# Patient Record
Sex: Female | Born: 1980 | Race: White | Hispanic: No | Marital: Married
Health system: Southern US, Community
[De-identification: ages and names within clinical notes are randomized; demographics above are authoritative.]

---

## 2004-07-08 ENCOUNTER — Emergency Department: Payer: Self-pay | Admitting: Emergency Medicine

## 2004-07-17 ENCOUNTER — Emergency Department: Payer: Self-pay | Admitting: Emergency Medicine

## 2004-07-21 ENCOUNTER — Inpatient Hospital Stay (HOSPITAL_COMMUNITY): Admission: AD | Admit: 2004-07-21 | Discharge: 2004-07-21 | Payer: Self-pay | Admitting: Obstetrics and Gynecology

## 2004-07-27 ENCOUNTER — Encounter (INDEPENDENT_AMBULATORY_CARE_PROVIDER_SITE_OTHER): Payer: Self-pay | Admitting: *Deleted

## 2004-07-27 ENCOUNTER — Ambulatory Visit (HOSPITAL_COMMUNITY): Admission: AD | Admit: 2004-07-27 | Discharge: 2004-07-27 | Payer: Self-pay | Admitting: Obstetrics and Gynecology

## 2004-07-30 ENCOUNTER — Encounter (INDEPENDENT_AMBULATORY_CARE_PROVIDER_SITE_OTHER): Payer: Self-pay | Admitting: *Deleted

## 2004-07-30 ENCOUNTER — Inpatient Hospital Stay (HOSPITAL_COMMUNITY): Admission: AD | Admit: 2004-07-30 | Discharge: 2004-07-31 | Payer: Self-pay | Admitting: Gynecology

## 2005-07-12 IMAGING — US US OB COMP LESS 14 WK
1 series · 18 of 28 positions shown · non-contrast
Comparison: none

CLINICAL DATA: 10 week 3 day gestational age by LMP.  Rt sided pelvic pain.

[Series 1: us ob comp<14 wk · 18 of 37 slices shown]
[im 1/37]
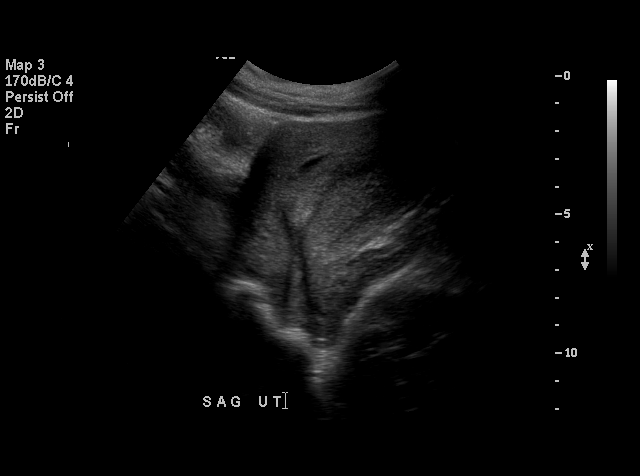
[im 3/37]
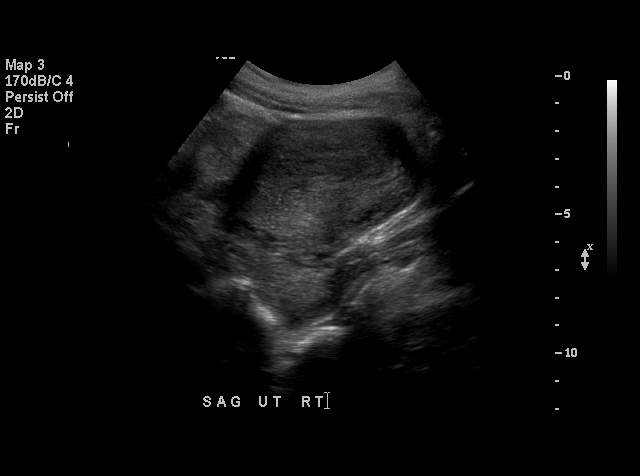
[im 5/37]
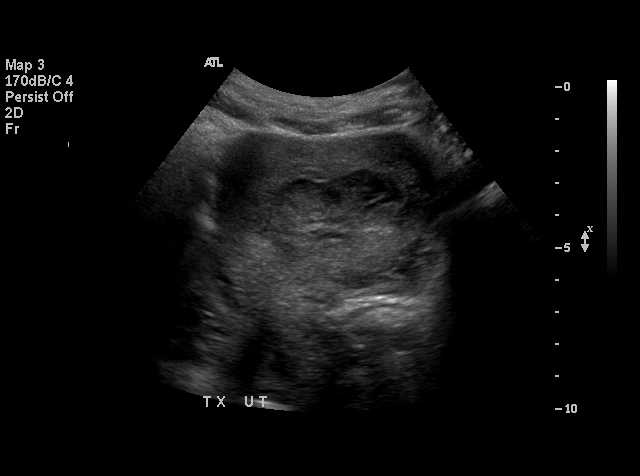
[im 7/37]
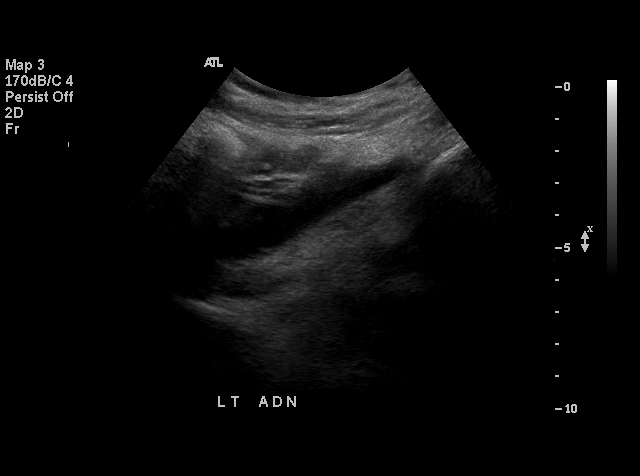
[im 10/37]
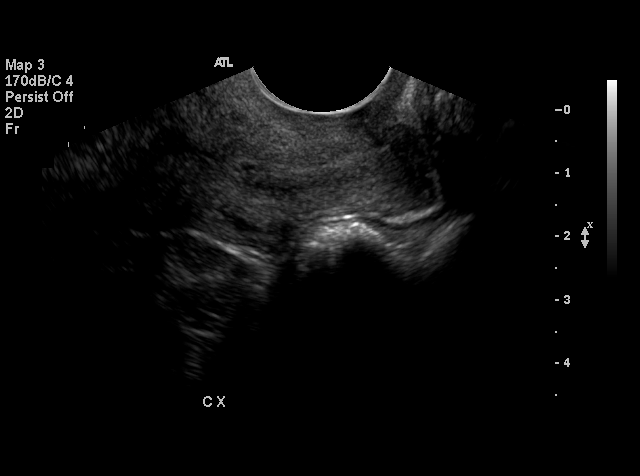
[im 11/37]
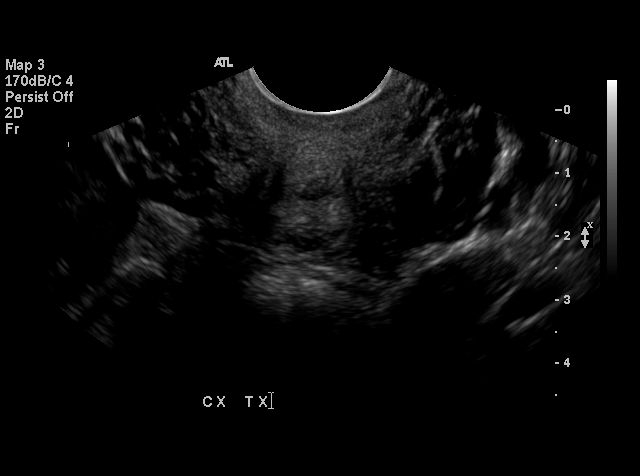
[im 14/37]
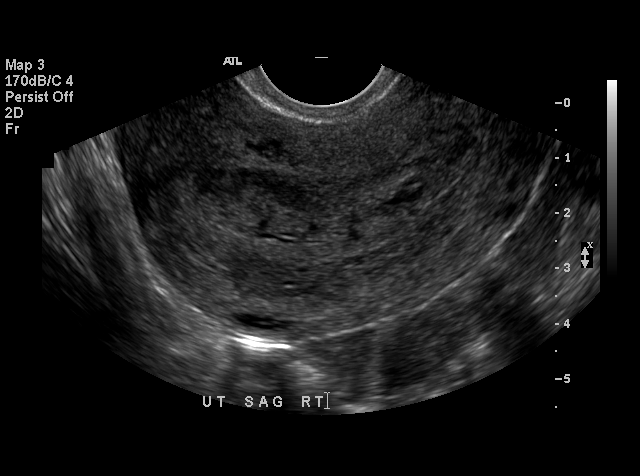
[im 15/37]
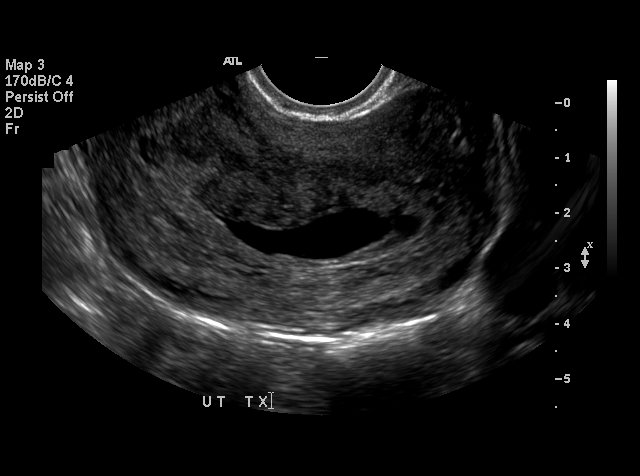
[im 18/37]
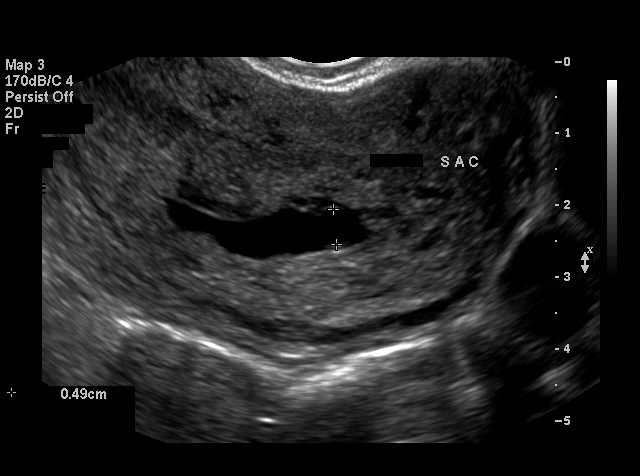
[im 19/37]
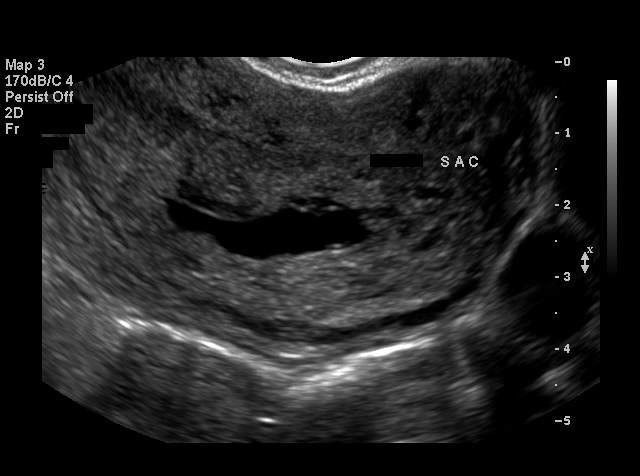
[im 22/37]
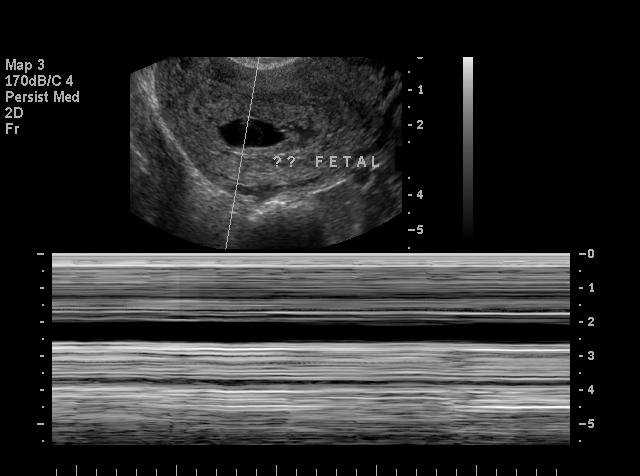
[im 23/37]
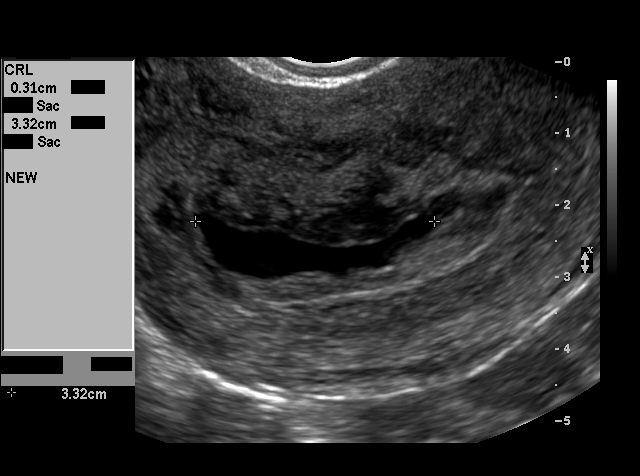
[im 26/37]
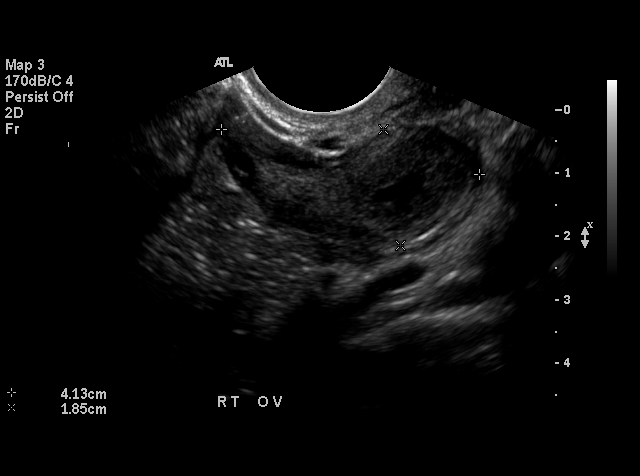
[im 29/37]
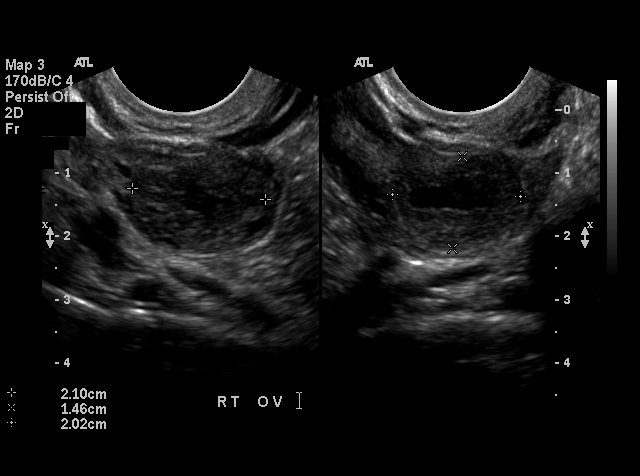
[im 30/37]
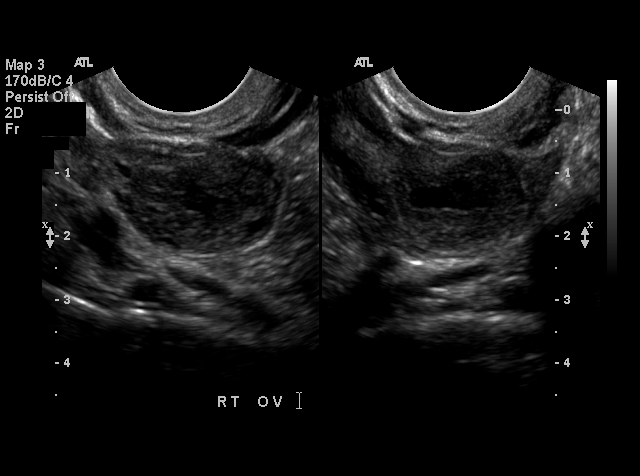
[im 33/37]
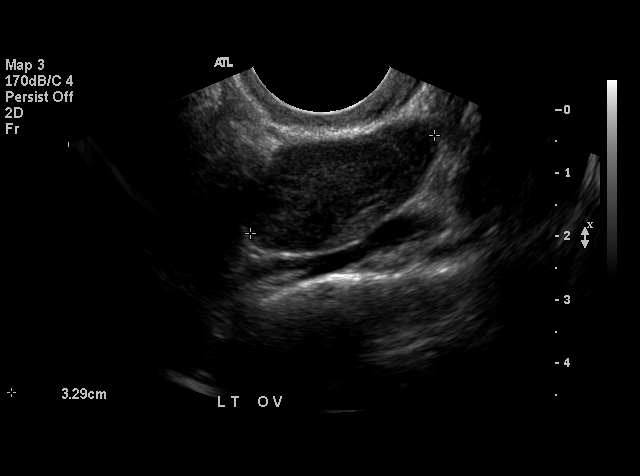
[im 34/37]
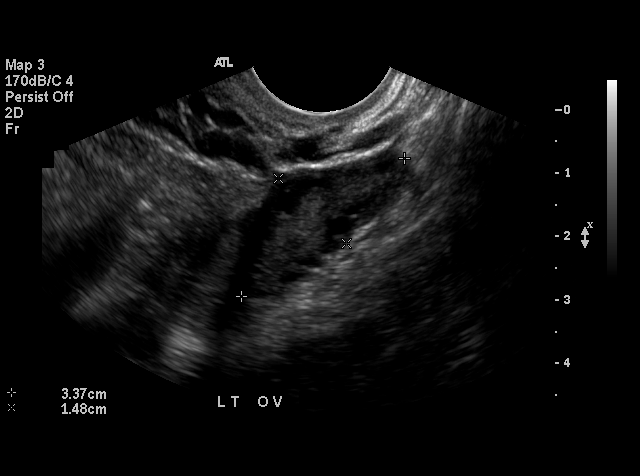
[im 37/37]
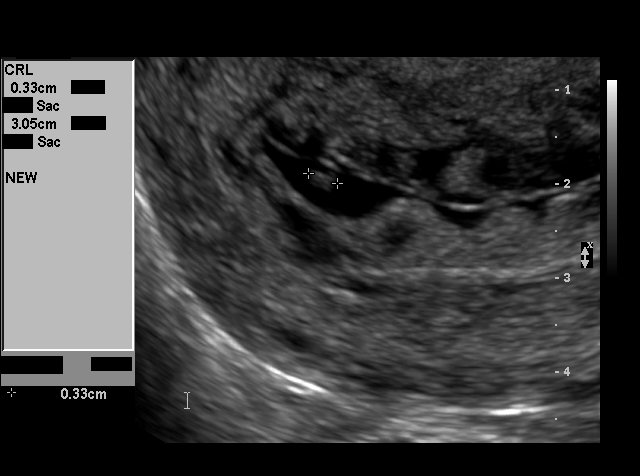

[18 of 28 positions shown; findings below may reference images not displayed]

OBSTETRICAL ULTRASOUND WITH TRANSVAGINAL:

A single intrauterine gestational sac is seen which has a very irregular shape.  The mean sac
diameter measures 3.1 cm, corresponding with a gestational age of 8 weeks 3 days.  A yolk sac is
seen as well as an embryo with crown rump length of 3 mm, corresponding with a gestational age of 6
weeks 0 days.  No embryonic cardiac activity is visualized, although this may not be seen until
crown rump length reaches 5 mm.  Small subchorionic hemorrhage is noted.  

A corpus luteum cyst is noted in the right ovary measuring approximately 2.0 cm.  The left ovary is
normal in appearance.  There is no evidence of adnexal masses or free fluid.
IMPRESSION: 1.  Single intrauterine gestation, with estimated gestational age of 6 weeks 0 days by crown rump
length.  This is approximately 4 weeks behind LMP and the gestational sac has a very irregular
shape, which is a poor prognostic sign.  Follow-up ultrasound is recommended in approximately 4-5
days to assess pregnancy progression.

2.  Small subchorionic hemorrhage noted.  

3.  Small right ovarian corpus luteum.  No other adnexal mass or free fluid.

## 2005-07-22 IMAGING — US US OB TRANSVAGINAL
1 series · 18 of 28 positions shown · non-contrast
Comparison: none

CLINICAL DATA: D&C with persistent bleeding.
 ULTRASOUND OF THE PELVIS, COMPLETE:
 This study is compared with the previous study from 07/27/04.  
 The endometrium is thickened at 27.5 mm.  There is a probable persistent gestational sac in the lower uterine segment with what appears to be some amnion.  There is also what appears to be hematoma in the cervix.  No free fluid is seen.  The ovaries are normal in appearance.

[Series 1: us pelvis complete · 18 of 49 slices shown]
[im 1/49]
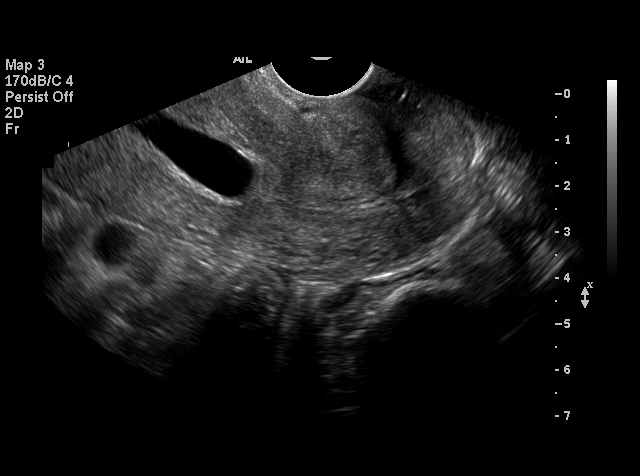
[im 4/49]
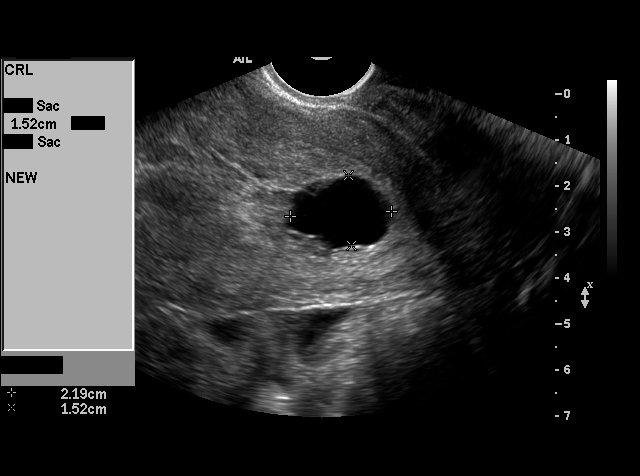
[im 6/49]
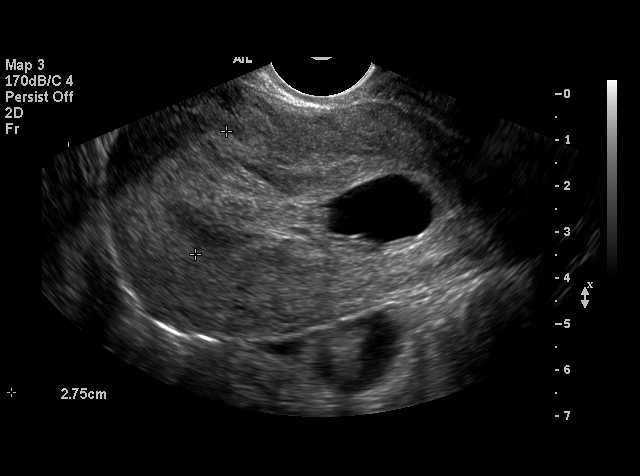
[im 9/49]
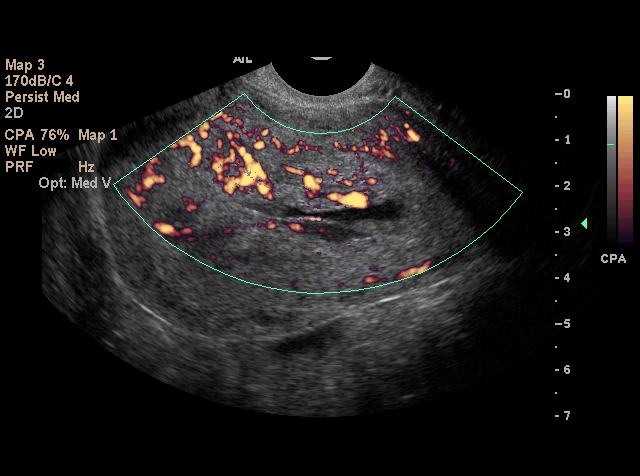
[im 13/49]
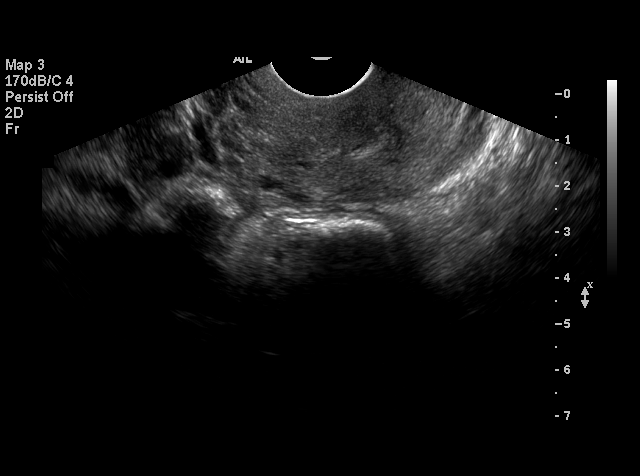
[im 15/49]
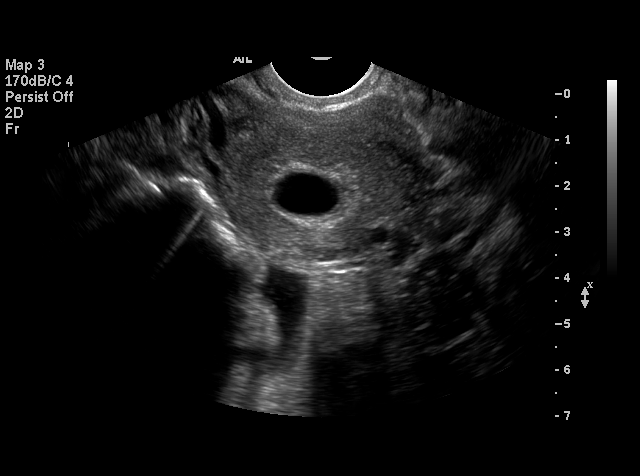
[im 18/49]
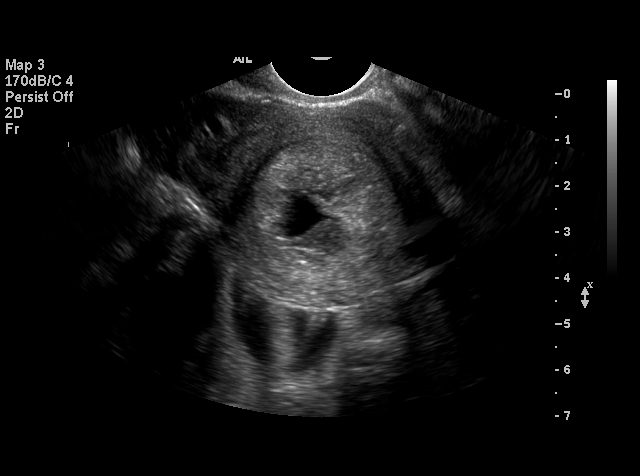
[im 20/49]
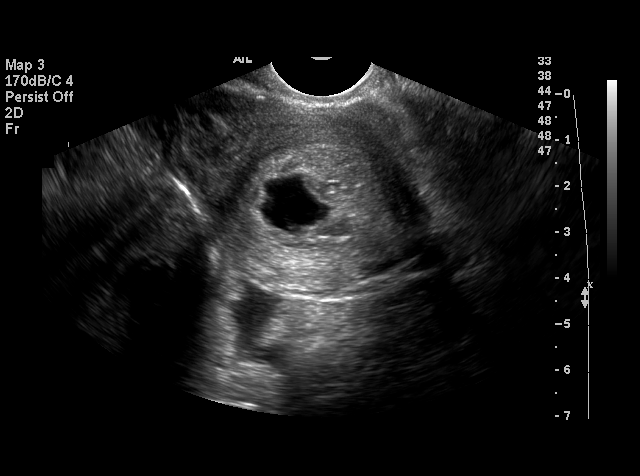
[im 24/49]
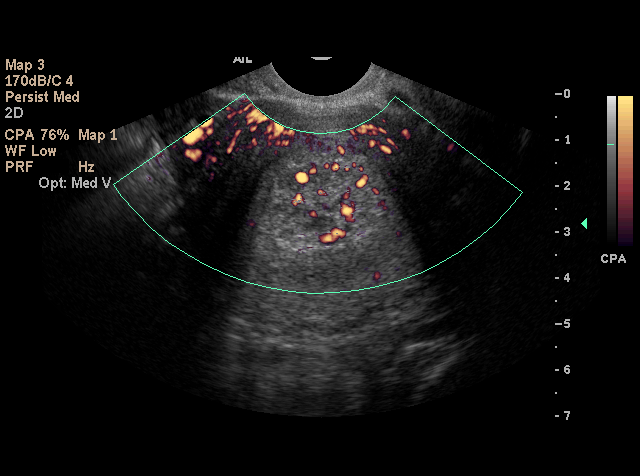
[im 25/49]
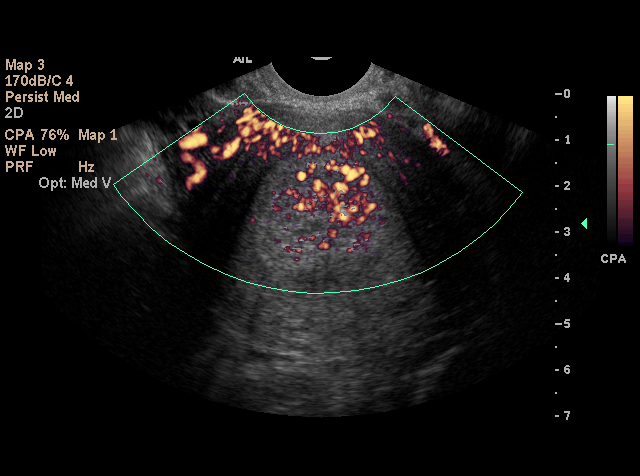
[im 29/49]
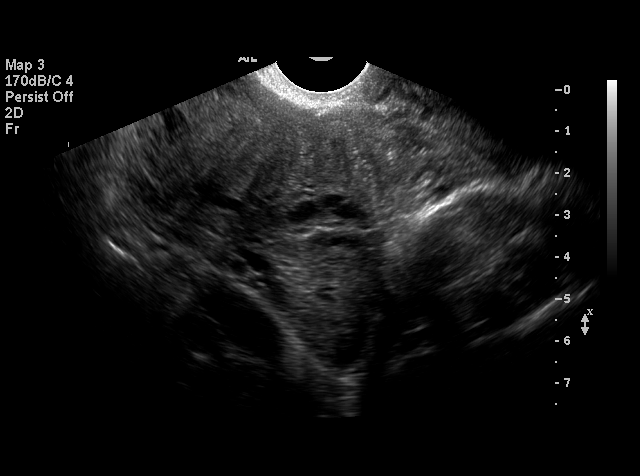
[im 31/49]
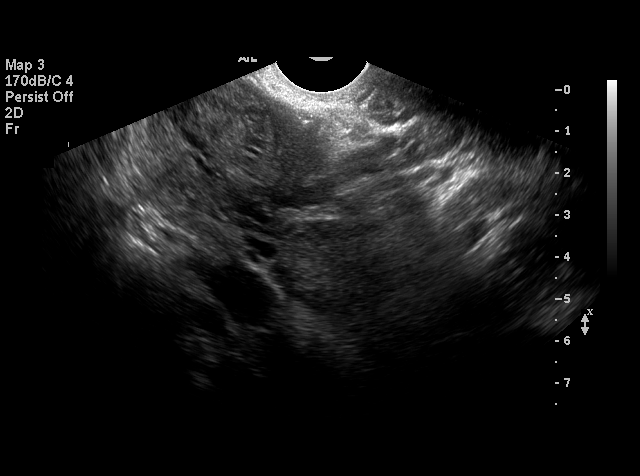
[im 34/49]
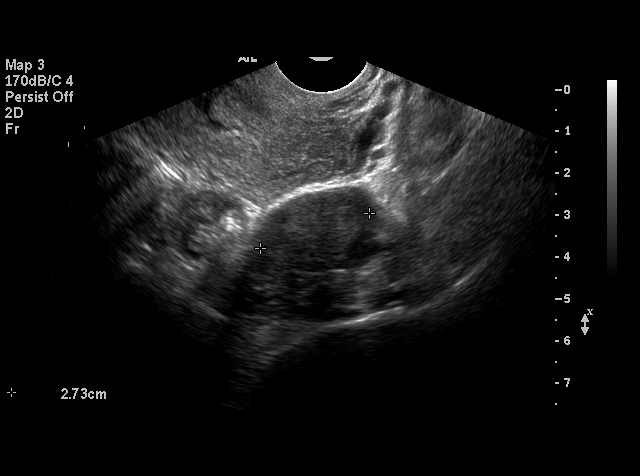
[im 38/49]
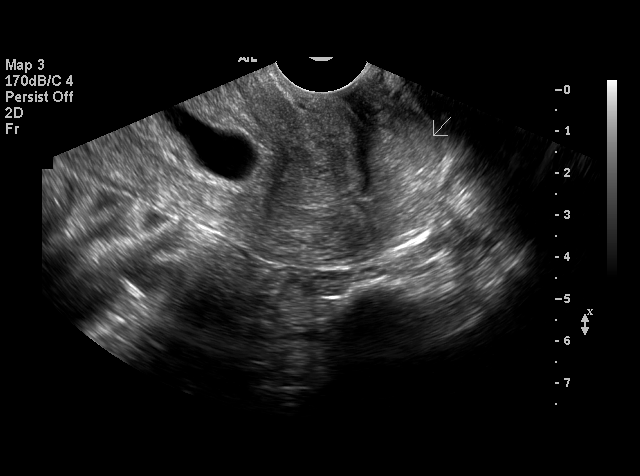
[im 40/49]
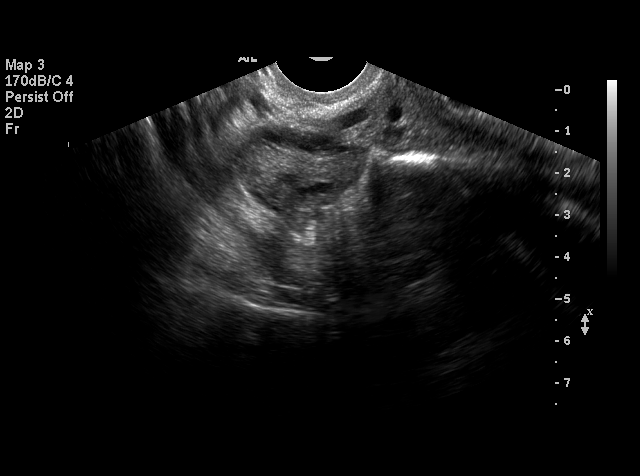
[im 43/49]
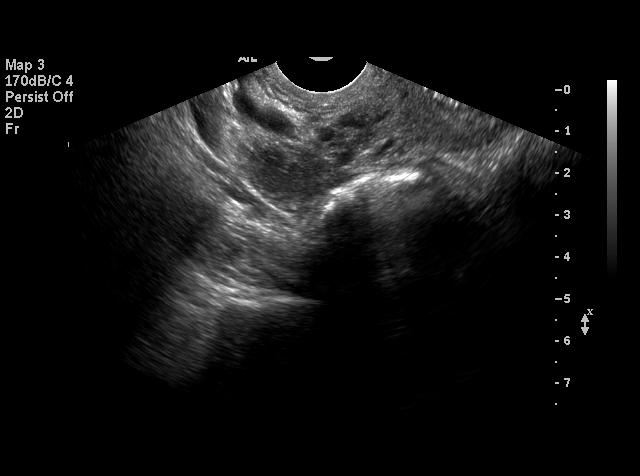
[im 45/49]
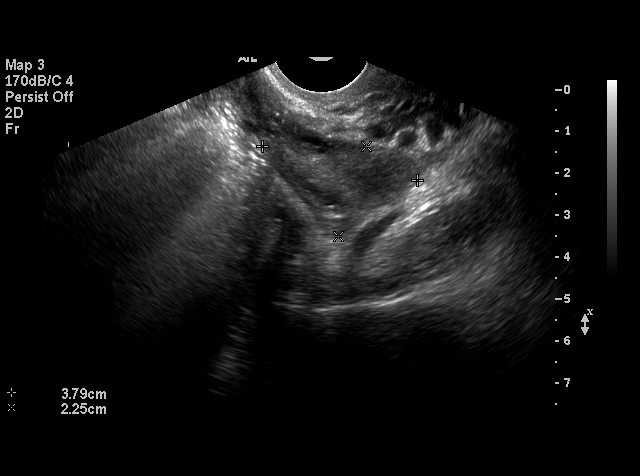
[im 49/49]
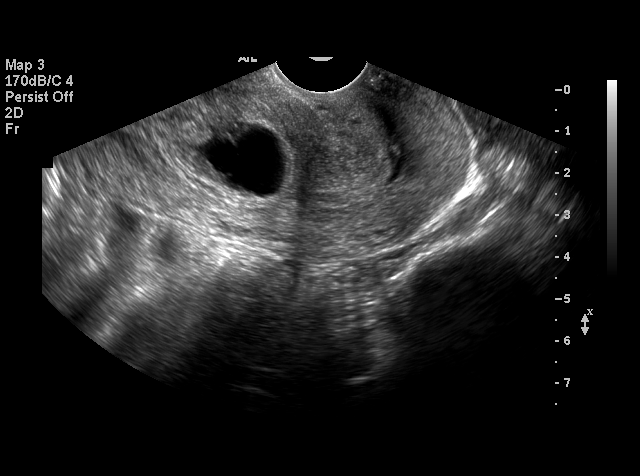

[18 of 28 positions shown; findings below may reference images not displayed]

IMPRESSION: 1.  Probable retained products with retained gestational sac in the lower uterine segment and endometrial thickening.  There also appears to be some hematoma in and around the cervix.  
 2.  Normal ovaries.

## 2005-07-22 IMAGING — US US OB COMP LESS 14 WK
1 series · 14 of 28 positions shown · non-contrast
Comparison: none

CLINICAL DATA: Incomplete abortion.  Failed IUP.  Continued vaginal bleeding.  Evaluate for retained products. 
OBSTETRICAL ULTRASOUND:
Compared with previous study earlier today, an abnormally shaped intrauterine gestational sac is again seen in the lower uterine segment which is not significantly changed.  No other uterine abnormalities are seen.  No adnexal abnormalities or free fluid are identified.

[Series 1: us ob comp less 14 wk · 0.29mm/px · 14 of 29 slices shown]
[im 2/29]
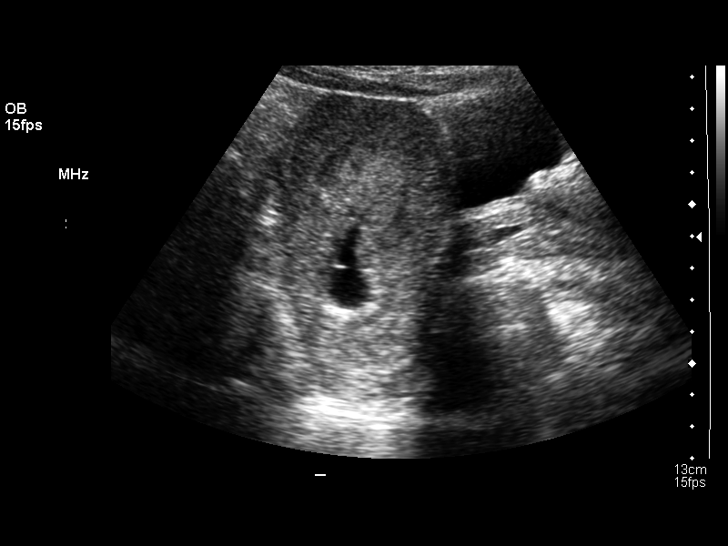
[im 4/29]
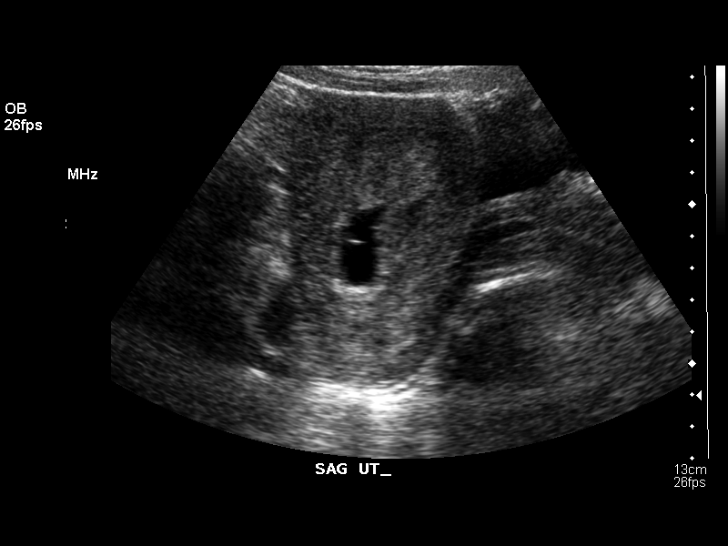
[im 6/29]
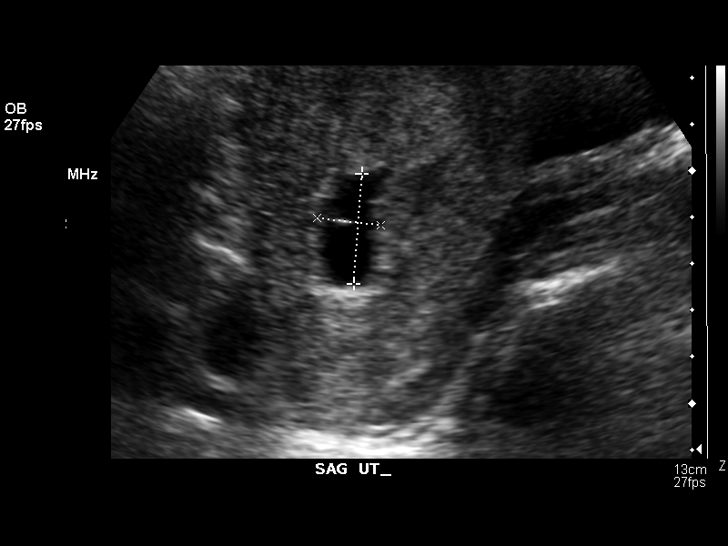
[im 8/29]
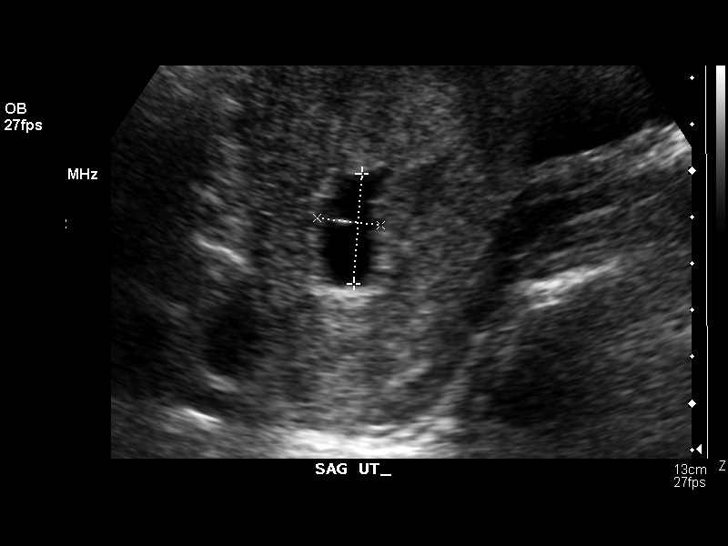
[im 10/29]
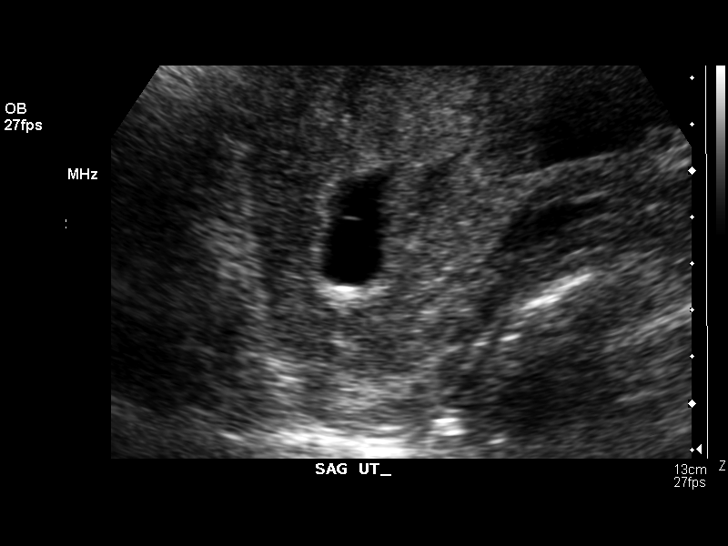
[im 12/29]
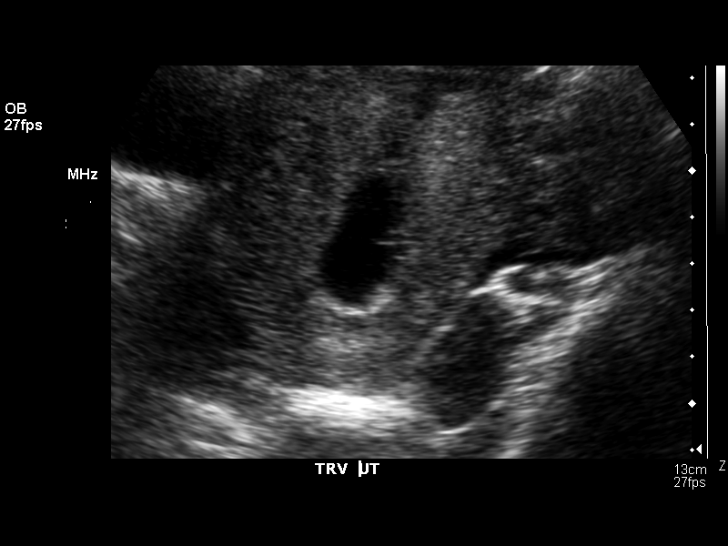
[im 14/29]
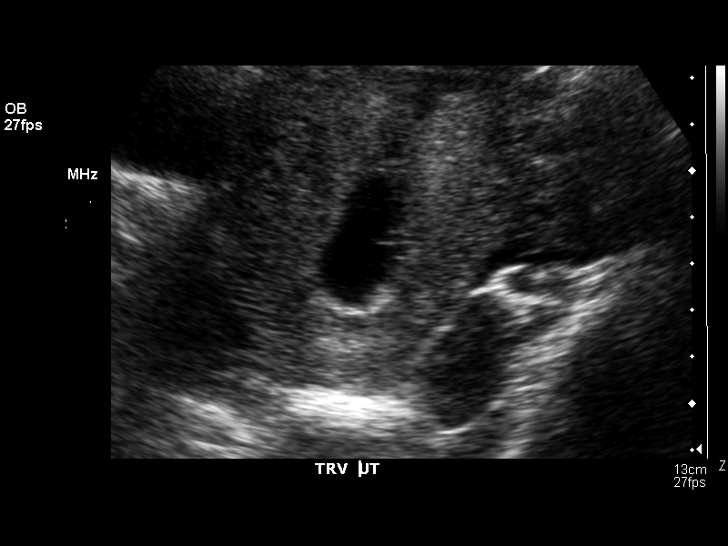
[im 16/29]
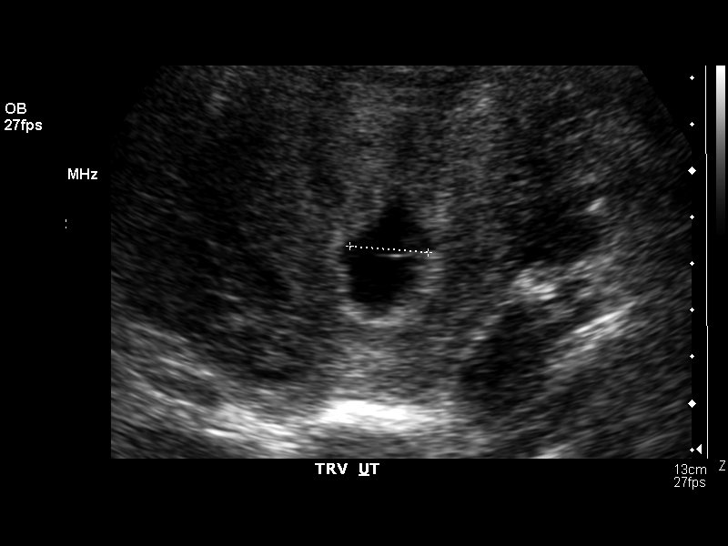
[im 18/29]
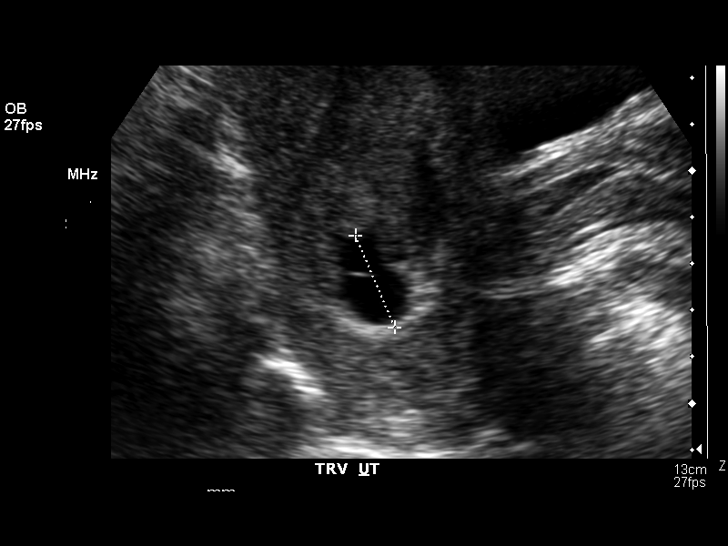
[im 20/29]
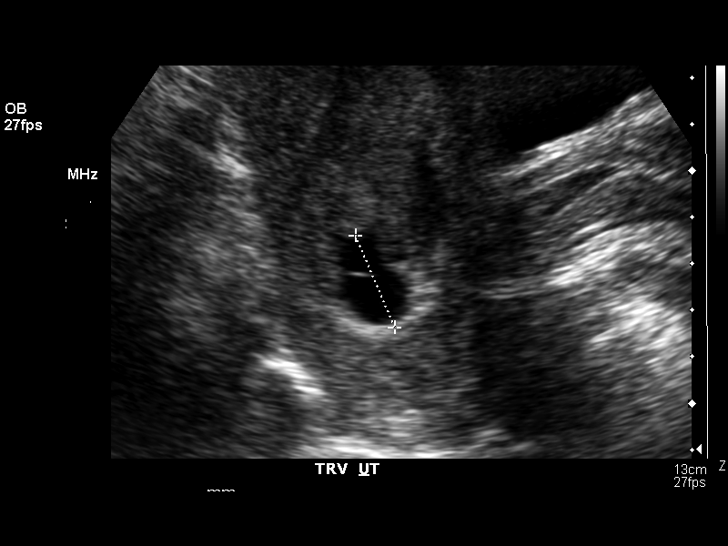
[im 22/29]
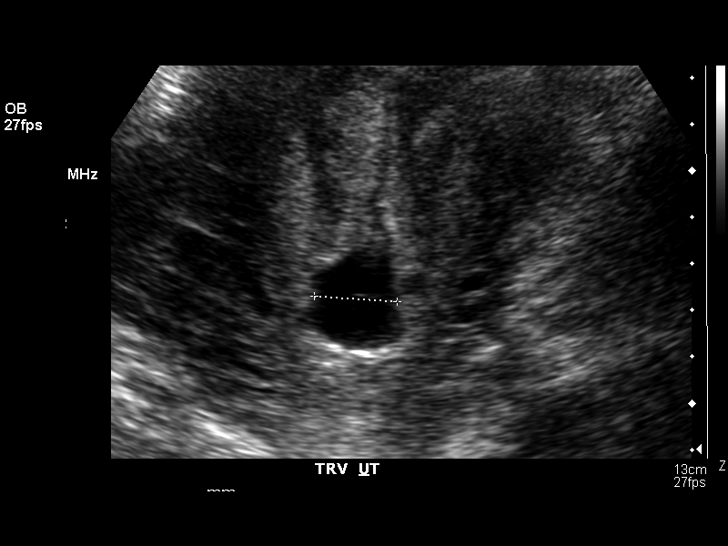
[im 24/29]
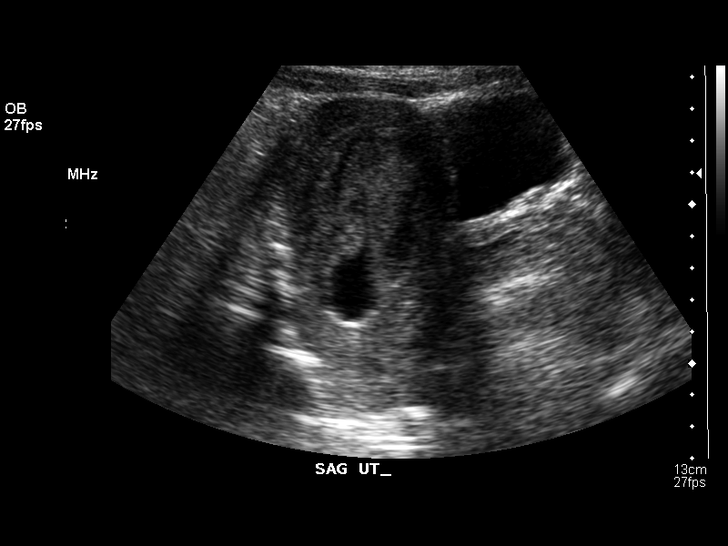
[im 26/29]
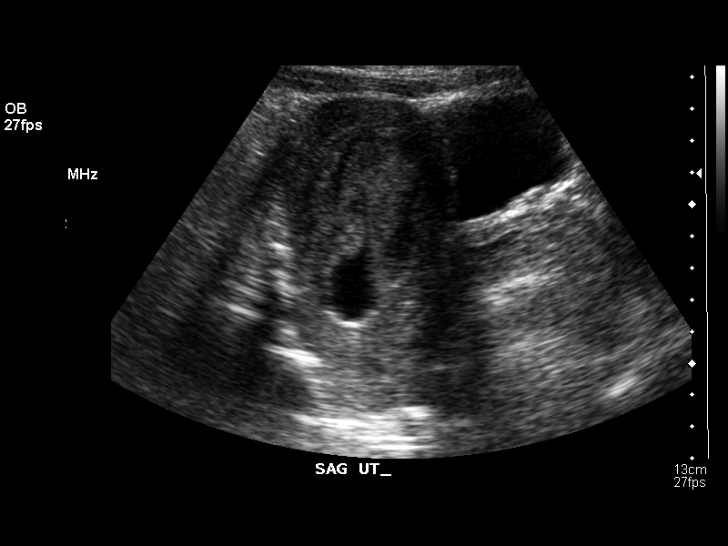
[im 29/29]
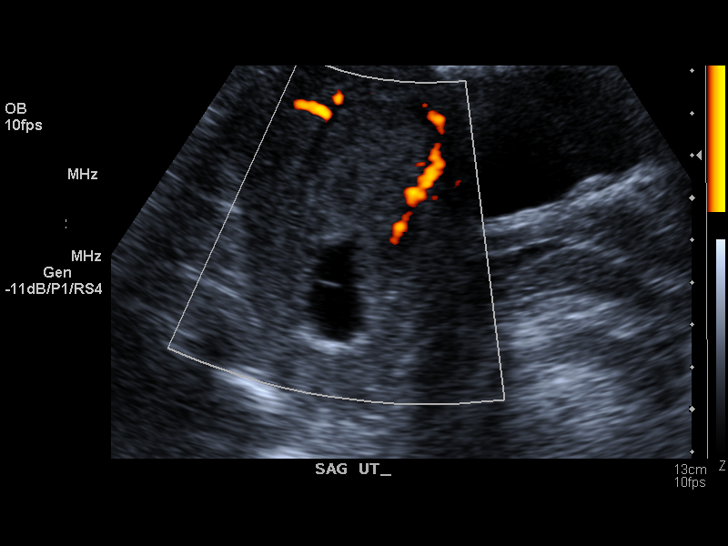

[14 of 28 positions shown; findings below may reference images not displayed]

IMPRESSION: Persistent abnormal intrauterine gestational sac within the lower uterine segment, and without significant change since prior study.

## 2007-03-06 ENCOUNTER — Inpatient Hospital Stay: Payer: Self-pay

## 2008-01-16 ENCOUNTER — Emergency Department (HOSPITAL_COMMUNITY): Admission: EM | Admit: 2008-01-16 | Discharge: 2008-01-16 | Payer: Self-pay | Admitting: Emergency Medicine

## 2008-10-18 ENCOUNTER — Emergency Department: Payer: Self-pay | Admitting: Internal Medicine

## 2008-12-20 ENCOUNTER — Emergency Department: Payer: Self-pay | Admitting: Emergency Medicine

## 2009-01-24 ENCOUNTER — Emergency Department: Payer: Self-pay | Admitting: Emergency Medicine

## 2009-04-12 ENCOUNTER — Emergency Department: Payer: Self-pay | Admitting: Emergency Medicine

## 2009-05-02 ENCOUNTER — Emergency Department: Payer: Self-pay | Admitting: Unknown Physician Specialty

## 2009-08-02 ENCOUNTER — Emergency Department: Payer: Self-pay | Admitting: Emergency Medicine

## 2009-10-19 ENCOUNTER — Ambulatory Visit: Payer: Self-pay | Admitting: Unknown Physician Specialty

## 2009-12-28 ENCOUNTER — Emergency Department: Payer: Self-pay | Admitting: Emergency Medicine

## 2010-01-09 ENCOUNTER — Emergency Department: Payer: Self-pay | Admitting: Emergency Medicine

## 2010-02-27 ENCOUNTER — Ambulatory Visit: Payer: Self-pay

## 2010-08-07 ENCOUNTER — Observation Stay: Payer: Self-pay | Admitting: Obstetrics and Gynecology

## 2010-09-04 ENCOUNTER — Inpatient Hospital Stay: Payer: Self-pay

## 2010-10-05 ENCOUNTER — Encounter: Payer: Self-pay | Admitting: Obstetrics and Gynecology

## 2010-12-10 ENCOUNTER — Ambulatory Visit: Payer: Self-pay | Admitting: Internal Medicine

## 2011-01-08 ENCOUNTER — Emergency Department: Payer: Self-pay | Admitting: Emergency Medicine

## 2011-01-31 NOTE — Op Note (Signed)
[name], Weeks           ACCOUNT NO.:  [id number]   MEDICAL RECORD NO.:  [id number]          PATIENT TYPE:  INP   LOCATION:  9374                          FACILITY:  [facility]   PHYSICIAN:  Ivor Costa. Farrel Gobble, M.D. DATE OF BIRTH:  January 08, 1981   DATE OF PROCEDURE:  07/31/2004  DATE OF DISCHARGE:  07/31/2004                                 OPERATIVE REPORT   PREOPERATIVE DIAGNOSES:  Retained products of conception after first  trimester dilation and curettage.   POSTOPERATIVE DIAGNOSES:  Retained products of conception after first  trimester dilation and curettage.   PROCEDURE:  Suction D&C.   SURGEON:  Ivor Costa. Farrel Gobble, M.D.   ANESTHESIA:  MAC and paracervical block.   ESTIMATED BLOOD LOSS:  Minimal.   FINDINGS:  Uterus was anteverted, slightly enlarged, there was copious  fibrotic tissue with smooth contours at the end. Pathology was uterine  contents.   DESCRIPTION OF PROCEDURE:  The patient was taken to the operating room, MAC  anesthesia was induced, placed in the dorsal lithotomy position, prepped and  draped in the usual sterile fashion. A bimanual exam was performed.  The  orientation of the uterus was confirmed. A sterile weighted speculum was  then placed in the vagina as was the Simms. The cervix was visualized,  stabilized with a single tooth tenaculum and a paracervical block with equal  parts of 2% lidocaine and 0.25% Marcaine was placed circumferentially around  the cervix. The cervix was then gently dilated up to 25 [place] after which a  #7 suction curette was advanced through the cervix towards the fundus.  Copious amounts of fibrotic appearing tissue was obtained.  After no more  tissue was evident, a gentle curettage was performed. A slight amount of  tissue again was obtained, replacement of the suction curette came back  again negative and recuretting confirms no tissue and good cry on the  anterior wall as well as the remainder of the cavity.  The cavity  contours  were smooth. The patient tolerated the procedure well. Sponge, lap and  needle counts correct x2. She received Methergine intraoperatively and there  was minimal cervical bleeding at the end of the procedure.     Trac   THL/MEDQ  D:  07/31/2004  T:  08/01/2004  Job:  [id number]

## 2011-01-31 NOTE — Op Note (Signed)
[name], Weeks           ACCOUNT NO.:  [id number]   MEDICAL RECORD NO.:  [id number]          PATIENT TYPE:  AMB   LOCATION:  [facility]C                           FACILITY:  [facility]   PHYSICIAN:  Juan H. Lily Peer, M.D.DATE OF BIRTH:  03-16-1981   DATE OF PROCEDURE:  07/27/2004  DATE OF DISCHARGE:                                 OPERATIVE REPORT   INDICATION FOR OPERATION:  A 30 year old gravida 2, para 1, approximately 35-  week gestation by last menstrual period, but a recent ultrasound  demonstrated a 6-week irregular gestational sac, with decreasing  quantitative beta HCGs.  The patient Rh negative.  She is A-negative and  received 300 mcg of RhoGAM prior to her D and E.   PREOPERATIVE DIAGNOSES:  First trimester missed abortion.   POSTOPERATIVE DIAGNOSES:  First trimester missed abortion.   SURGEON:  Juan H. Lily Peer, M.D.   ANESTHESIA:  1.  MAC.  2.  Paracervical block with 2% Xylocaine, with 1:100,000 epinephrine for 10      cc.   PROCEDURE PERFORMED:  First trimester D and E.   DESCRIPTION OF PROCEDURE:  After the patient was adequately counseled, she  was taken to the operating room, where she underwent a successful MAC  anesthesia.  She was placed in the low lithotomy position.  Examination  under anesthesia, had demonstrated slightly anteverted, 8 to 10-week size  uterus with no palpable adnexal masses.  The cervix was slightly dilated.  The vagina and cervix were cleansed with Betadine solution.  A red rubber  Roxan Hockey was inserted, in an effort to evacuate the bladder of its contents,  for approximately 50 cc.  A single tooth tenaculum was placed on the  anterior cervical lip, after 2% Xylocaine with 1:100,000 epinephrine had  been infiltrated into the cervical stroma, at the 2, 4, 8 and 10 o'clock  positions.  The cervix was dilated to allow a 7 mm suction curette to be  introduced into the intrauterine cavity, to remove remaining products of  conception.  This  was interchanged with a Hunter's curette, to completely  evacuate the cavity.  The single tooth tenaculum was removed.  The patient  tolerated the procedure well.  She received a gram of Ancef for prophylaxis  and was transferred to recovery room with stable vital signs.  Blood loss  was less than 100 cc.  IV fluids 500 cc.     Juan   JHF/MEDQ  D:  07/27/2004  T:  07/28/2004  Job:  [id number]

## 2011-01-31 NOTE — H&P (Signed)
Kari Weeks, Kari Weeks           ACCOUNT NO.:  [id number]   MEDICAL RECORD NO.:  [id number]          PATIENT TYPE:  INP   LOCATION:  9374                          FACILITY:  [facility]   PHYSICIAN:  Juan H. Lily Peer, M.D.DATE OF BIRTH:  [birth year]   DATE OF ADMISSION:  07/30/2004  DATE OF DISCHARGE:                                HISTORY & PHYSICAL   CHIEF COMPLAINT:  Cramping and vaginal bleeding.   HISTORY:  The patient is a 30 year old, gravida 2, para 1, AB 1 (12 weeks by  last menstrual period), who on Saturday, July 27, 2004, underwent a D&E  for a missed AB.  She had received RhoGAM 300 mcg prior to her D&E in the  operating room.  The presented to the office today complaining of some  abdominal cramping and vaginal bleeding.  Her CBC had demonstrated a  hemoglobin and hematocrit of 12.4 and 37.3 respectively, with a platelet  count of 168,000.  Her pathology report from July 27, 2004 demonstrated  products of conception, and she underwent an ultrasound today which  demonstrated retained products of conception in the lower uterine segment,  measuring 2.2 x 1.6 cm.  Gestational sac with a yolk sac was still seen.  The patient is in the process of aborting.  The patient will be admitted to  [facility], placed on IV antibiotic, and see if over the course of the  next 12-14 hours she will spontaneously pass the remaining products of  conception, or, if not, she will be taken to the operating room for a repeat  and complete evacuation of the uterine cavity.   PAST MEDICAL HISTORY:  No major medical problems reported in the past.   ALLERGIES:  The patient denies any allergies.   PHYSICAL EXAMINATION:  VITAL SIGNS:  She is 127 pounds, height 5 feet, 4  inches tall.  HEENT:  Unremarkable.  NECK:  Supple.  Trachea midline.  No carotid bruits.  No thyromegaly.  LUNGS:  Clear to auscultation without rhonchi or wheezes.  HEART:  Regular rate and rhythm.  No murmurs or  gallops.  BREAST EXAM:  Not done.  ABDOMEN:  Soft, nontender, without rebound or guarding.  PELVIC:  Bartholin's, urethra, Skene's glands within normal limits.  Vagina  and cervix with blood present in the vaginal vault.  Uterus slightly tender.  Adnexa difficult to evaluate, due to some tenderness the patient had  experienced on examination.  RECTAL EXAM:  Not done.   ASSESSMENT:  A 30 year old, gravida 2, para 1, AB1, status post D&E on  July 27, 2004, with continued vaginal bleeding.  It appear that the  patient may be in the process of spontaneously aborting some of the retained  products of conception.  Recent pathology report demonstrated products of  conception.  The patient did receive RhoGAM 300 mcg on July 27, 2004.  She will be placed on Unasyn 3 gm IV q.6h., and will be kept NPO after  midnight.  She will be given analgesics for pain, such as Percocet 1-2  tablets p.o. q.4-6h. p.r.n. pain.  Will repeat her CBC in the morning, as  well as an ultrasound, and at that point determine if she has not passed the  products of conception to take her back to the operating room for a D&E.  All questions were answered, and will answer accordingly.   PLAN:  Per assessment above.     Juan   JHF/MEDQ  D:  07/30/2004  T:  07/30/2004  Job:  [id number]

## 2011-07-18 ENCOUNTER — Emergency Department: Payer: Self-pay | Admitting: Unknown Physician Specialty

## 2011-10-16 ENCOUNTER — Emergency Department: Payer: Self-pay | Admitting: Emergency Medicine

## 2011-12-30 IMAGING — CR DG CHEST 2V
1 series · 2 of 2 positions shown · non-contrast
Comparison: none

REASON FOR EXAM: cough/congestion/wheezing
COMMENTS:

PROCEDURE:     DXR - DXR CHEST PA (OR AP) AND LATERAL  - January 08, 2011  [DATE]
RESULT:     The lungs are mildly hyperinflated. There is no focal
infiltrate. The cardiac silhouette is normal in size. The pulmonary
vascularity is not engorged. I see no pleural effusion.

[Series 1: view not recorded · 0.17mm/px · 2 of 2 slices shown]
[im 1/2]
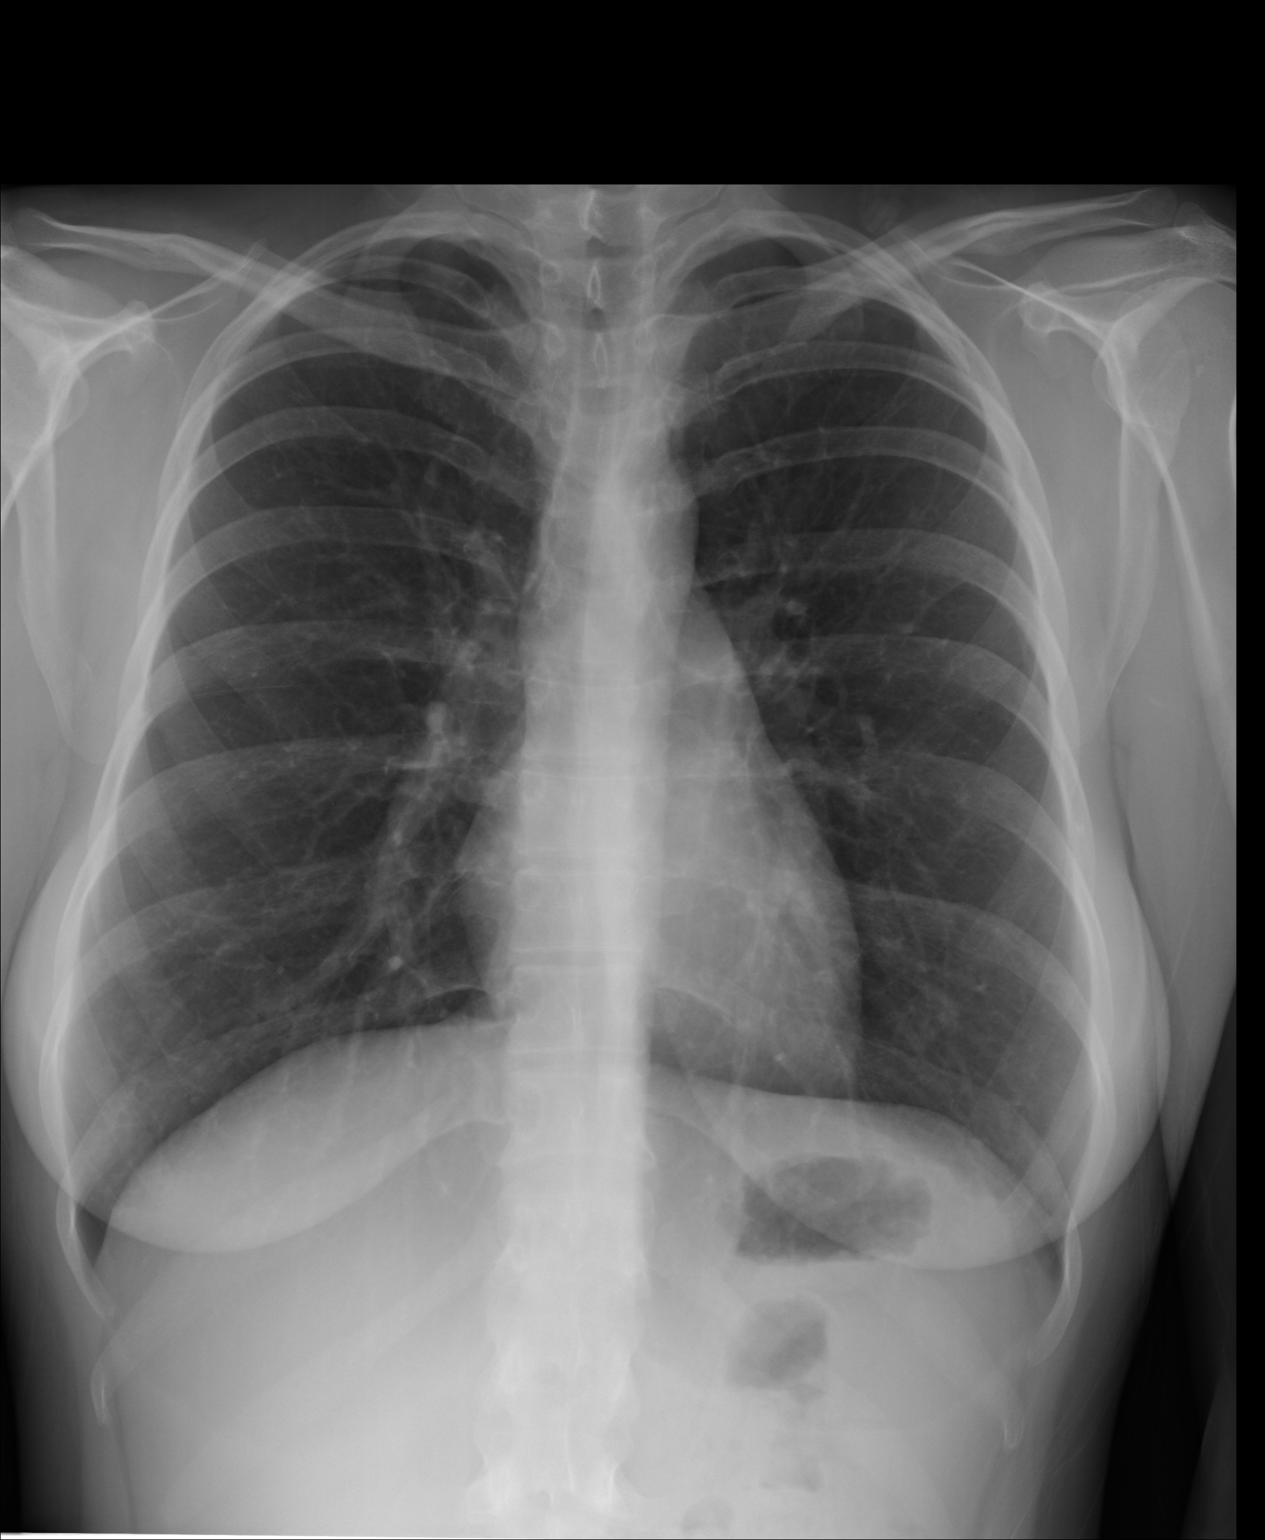
[im 2/2]
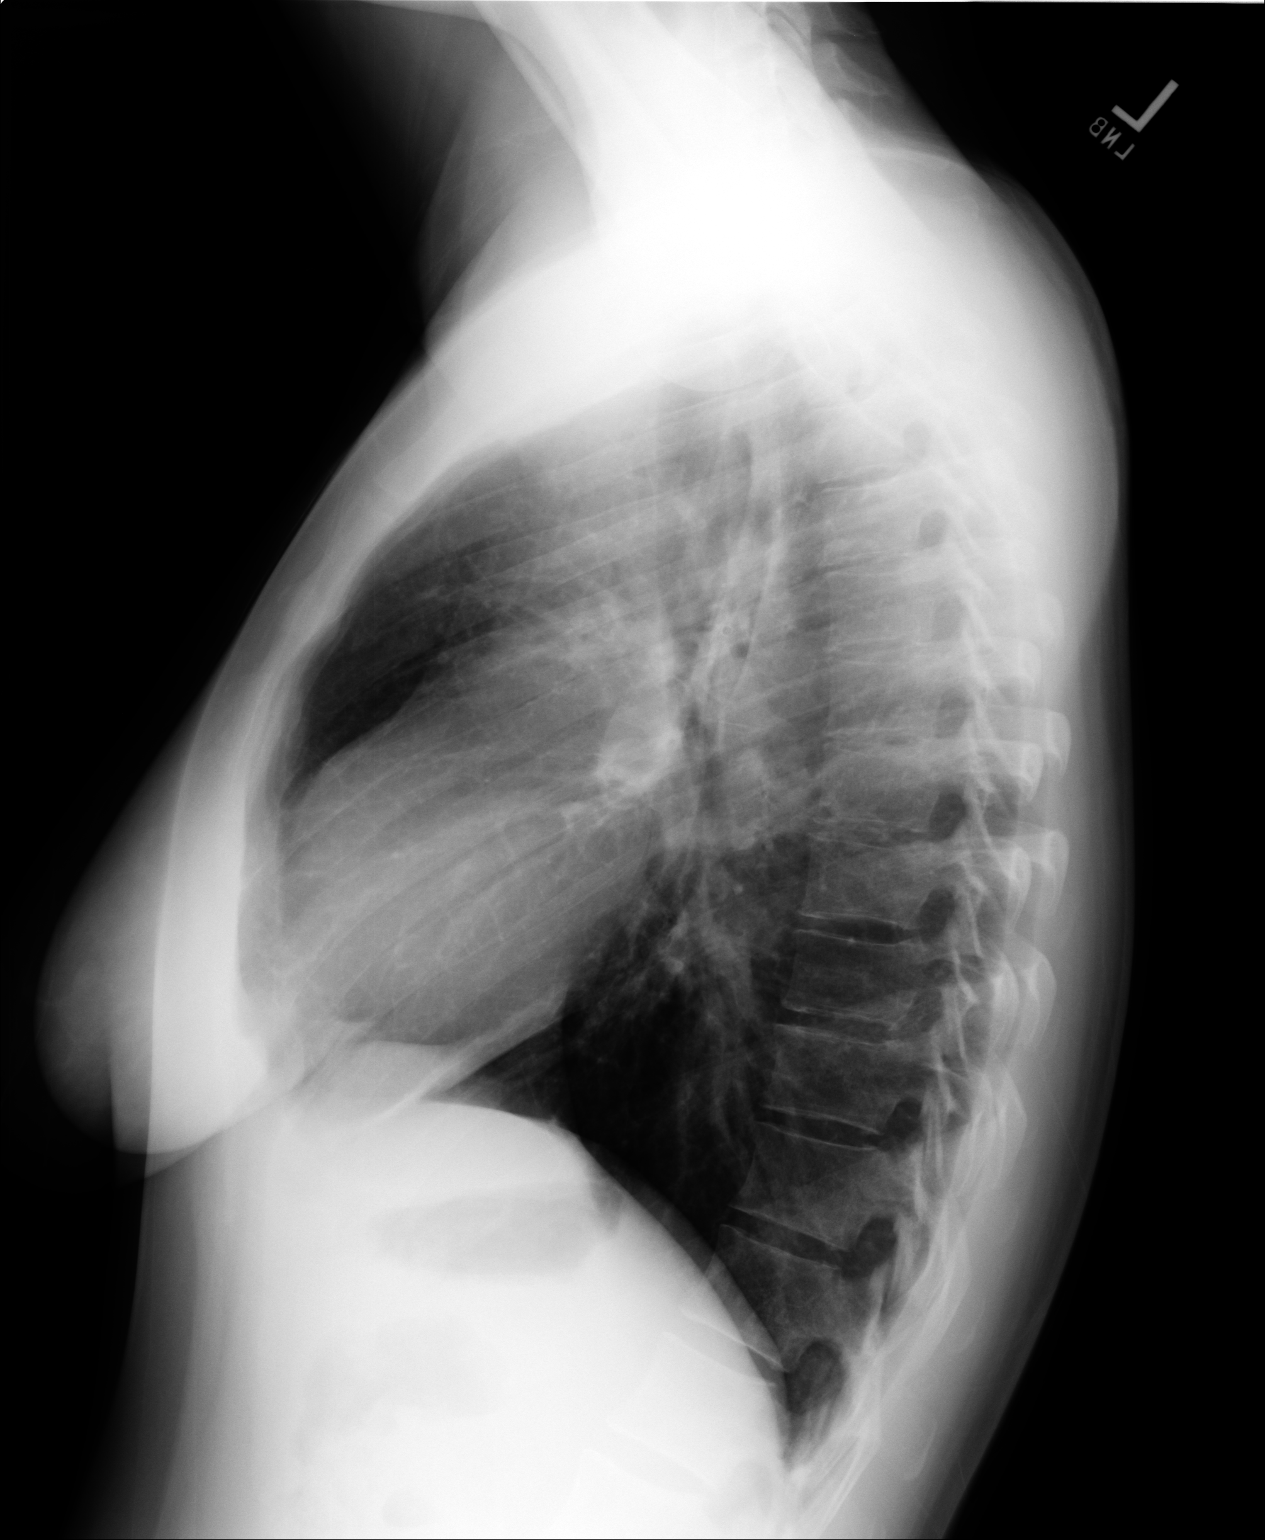

[2 of 2 positions shown; findings below may reference images not displayed]

IMPRESSION: There is mild hyperinflation which suggests reactive airway
disease. I do not see evidence of pneumonia.

## 2018-07-28 ENCOUNTER — Other Ambulatory Visit: Payer: Self-pay | Admitting: Certified Nurse Midwife

## 2019-09-22 ENCOUNTER — Telehealth: Payer: Self-pay

## 2019-09-22 NOTE — Telephone Encounter (Signed)
Error
